# Patient Record
Sex: Male | Born: 1995 | Race: Black or African American | Hispanic: No | Marital: Single | Smoking: Current every day smoker
Health system: Southern US, Community
[De-identification: ages and names within clinical notes are randomized; demographics above are authoritative.]

## PROBLEM LIST (undated history)

## (undated) ENCOUNTER — Emergency Department (HOSPITAL_COMMUNITY): Payer: Self-pay

## (undated) ENCOUNTER — Emergency Department (HOSPITAL_COMMUNITY): Admission: EM | Payer: Self-pay | Source: Home / Self Care

---

## 2017-04-19 ENCOUNTER — Emergency Department (HOSPITAL_COMMUNITY)
Admission: EM | Admit: 2017-04-19 | Discharge: 2017-04-19 | Disposition: A | Discharge status: Home / Self Care | Payer: Self-pay | Attending: Emergency Medicine | Admitting: Emergency Medicine

## 2017-04-19 ENCOUNTER — Encounter (HOSPITAL_COMMUNITY): Payer: Self-pay | Admitting: Emergency Medicine

## 2017-04-19 ENCOUNTER — Emergency Department (HOSPITAL_COMMUNITY): Payer: Self-pay

## 2017-04-19 DIAGNOSIS — W01198A Fall on same level from slipping, tripping and stumbling with subsequent striking against other object, initial encounter: Secondary | ICD-10-CM | POA: Insufficient documentation

## 2017-04-19 DIAGNOSIS — Y929 Unspecified place or not applicable: Secondary | ICD-10-CM | POA: Insufficient documentation

## 2017-04-19 DIAGNOSIS — Y939 Activity, unspecified: Secondary | ICD-10-CM | POA: Insufficient documentation

## 2017-04-19 DIAGNOSIS — W19XXXA Unspecified fall, initial encounter: Secondary | ICD-10-CM

## 2017-04-19 DIAGNOSIS — Y998 Other external cause status: Secondary | ICD-10-CM | POA: Insufficient documentation

## 2017-04-19 DIAGNOSIS — S20211A Contusion of right front wall of thorax, initial encounter: Secondary | ICD-10-CM | POA: Insufficient documentation

## 2017-04-19 DIAGNOSIS — S40021A Contusion of right upper arm, initial encounter: Secondary | ICD-10-CM | POA: Insufficient documentation

## 2017-04-19 MED ORDER — HYDROCODONE-ACETAMINOPHEN 5-325 MG PO TABS
1.0000 | ORAL_TABLET | Freq: Once | ORAL | Status: AC
  Administered 2017-04-19: 1 via ORAL
  Filled 2017-04-19: qty 1

## 2017-04-19 MED ORDER — METHOCARBAMOL 500 MG PO TABS: 1000.0000 mg | ORAL_TABLET | Freq: Four times a day (QID) | ORAL | 0 refills | Status: DC | PRN

## 2017-04-19 NOTE — ED Notes (Signed)
Continues to wait for return to xray.

## 2017-04-19 NOTE — ED Notes (Signed)
Instructed on use of IS.
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
## 2017-04-19 NOTE — ED Notes (Signed)
Pt in xray

## 2017-04-19 NOTE — ED Triage Notes (Signed)
Pt sts right upper arm pain after trip and fall onto fire place yesterday; CMS intact

## 2017-04-19 NOTE — Discharge Instructions (Signed)
Rest, Ice intermittently (in the first 24-48 hours), Gentle compression with an Ace wrap, and elevate (Limb above the level of the heart)   Take up to 800mg  of ibuprofen (that is usually 4 over the counter pills)  3 times a day for 5 days. Take with food.  For breakthrough pain you may take Robaxin. Do not drink alcohol, drive or operate heavy machinery when taking Robaxin.  Only use the arm sling for up to 2 days. Take the arm out and rotate the shoulder every 4 hours.   Do not hesitate to return to the emergency room for any new, worsening or concerning symptoms.  Please obtain primary care using resource guide below. Let them know that you were seen in the emergency room and that they will need to obtain records for further outpatient management.

## 2017-04-19 NOTE — ED Notes (Signed)
Returned from xray

## 2017-04-19 NOTE — ED Notes (Signed)
Patient transported to X-ray 

## 2018-07-19 IMAGING — CR DG SHOULDER 2+V*R*
3 series · 3 of 3 positions shown · non-contrast
Comparison: 04/19/2017.

CLINICAL DATA: Fall.

EXAM:
RIGHT SHOULDER - 2+ VIEW

[shoulder grashey]
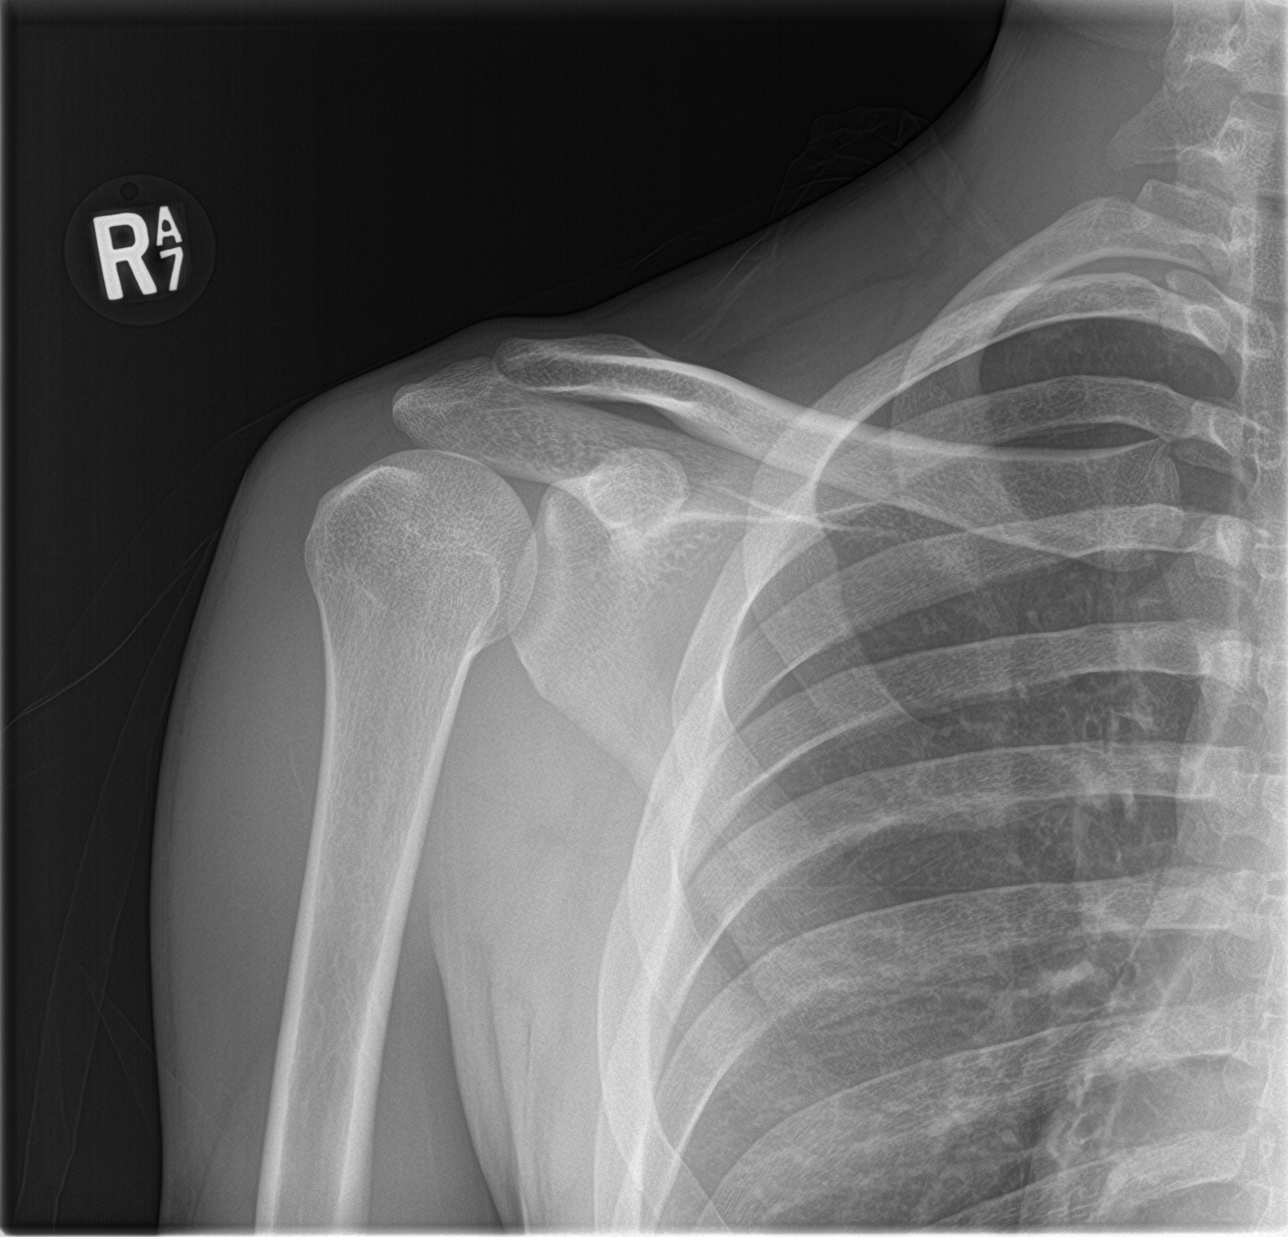

[shoulder y view]
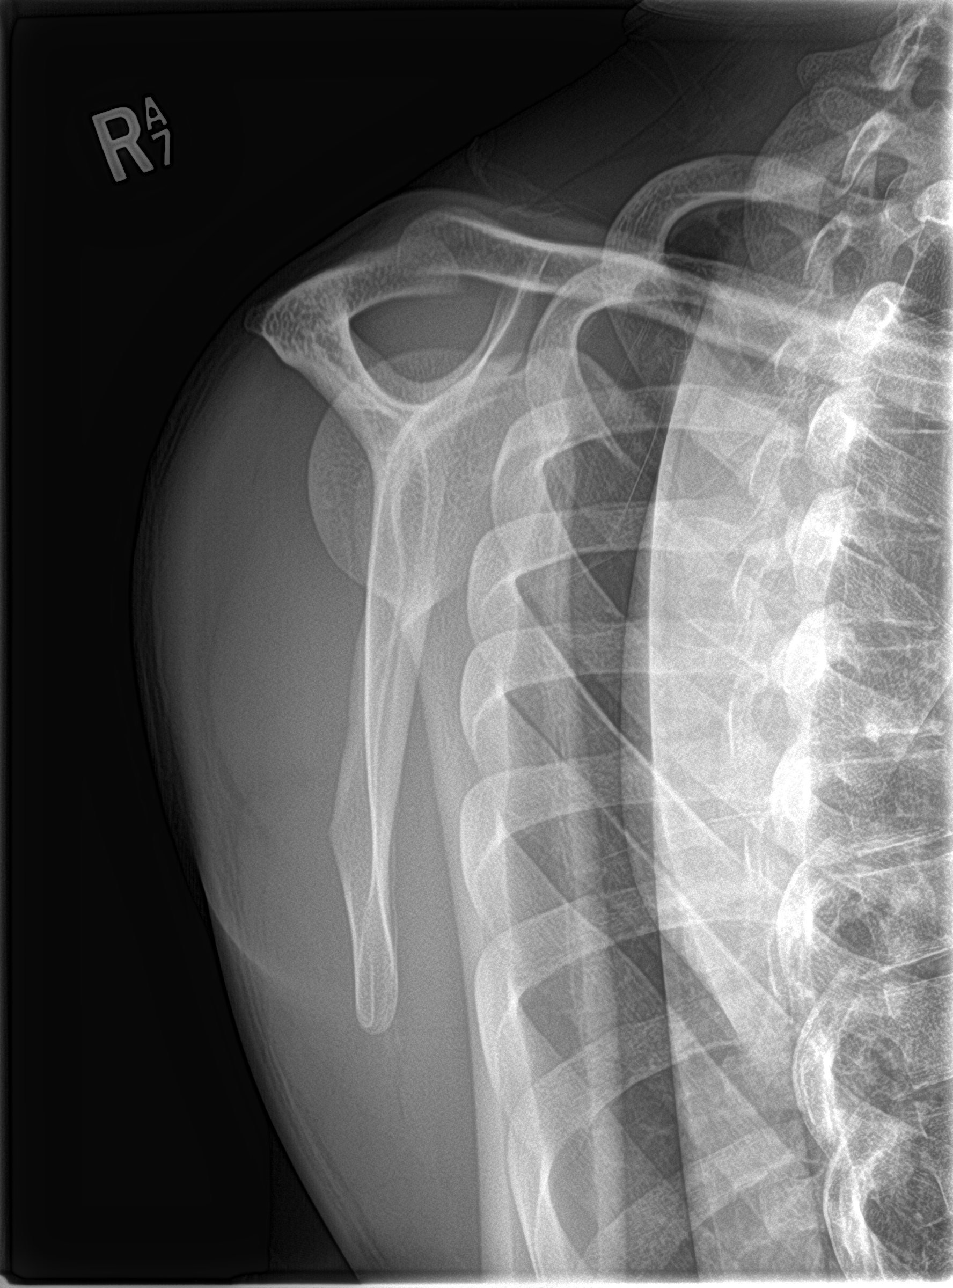

[shoulder axillary]
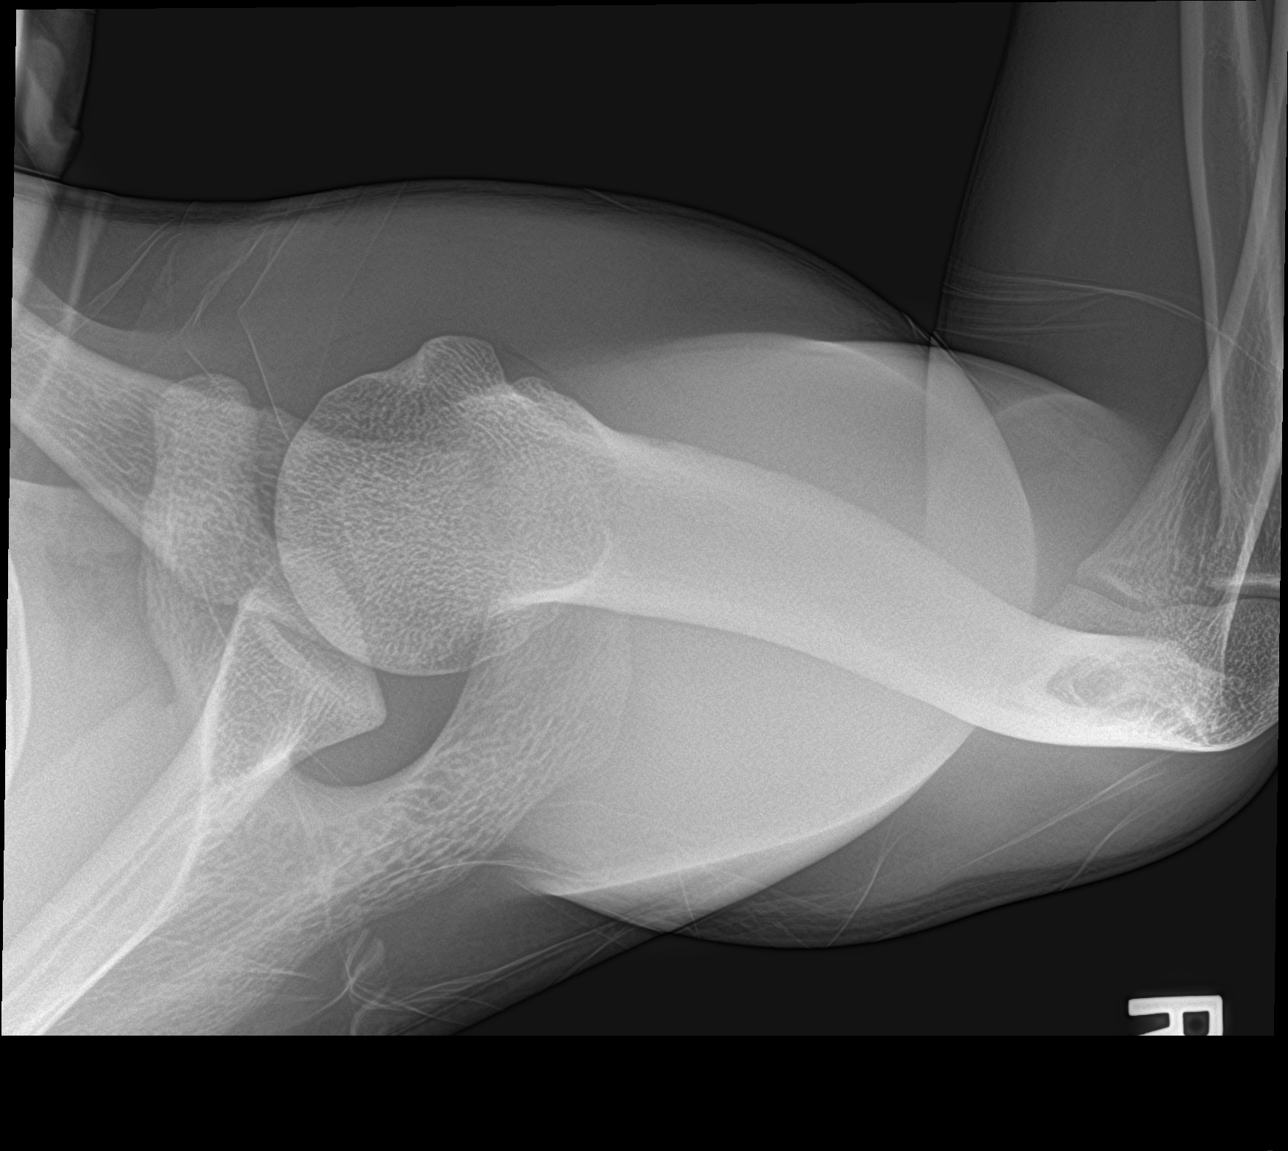

[3 of 3 positions shown; findings below may reference images not displayed]

FINDINGS: No acute bony or joint abnormality identified. No evidence of
fracture or dislocation.
IMPRESSION: No acute abnormality.

## 2021-09-13 ENCOUNTER — Emergency Department (HOSPITAL_COMMUNITY): Admission: EM | Admit: 2021-09-13 | Payer: Self-pay | Source: Home / Self Care

## 2021-09-13 ENCOUNTER — Encounter (HOSPITAL_COMMUNITY): Payer: Self-pay | Admitting: Emergency Medicine

## 2021-09-13 ENCOUNTER — Emergency Department (HOSPITAL_COMMUNITY): Payer: Self-pay

## 2021-09-13 ENCOUNTER — Emergency Department (HOSPITAL_COMMUNITY)
Admission: EM | Admit: 2021-09-13 | Discharge: 2021-09-13 | Disposition: A | Discharge status: Home / Self Care | Payer: Self-pay | Attending: Emergency Medicine | Admitting: Emergency Medicine

## 2021-09-13 DIAGNOSIS — S0990XA Unspecified injury of head, initial encounter: Secondary | ICD-10-CM

## 2021-09-13 DIAGNOSIS — S0083XA Contusion of other part of head, initial encounter: Secondary | ICD-10-CM | POA: Insufficient documentation

## 2021-09-13 DIAGNOSIS — M79671 Pain in right foot: Secondary | ICD-10-CM | POA: Insufficient documentation

## 2021-09-13 DIAGNOSIS — W108XXA Fall (on) (from) other stairs and steps, initial encounter: Secondary | ICD-10-CM | POA: Insufficient documentation

## 2021-09-13 MED ORDER — OXYCODONE-ACETAMINOPHEN 5-325 MG PO TABS
1.0000 | ORAL_TABLET | Freq: Once | ORAL | Status: AC
  Administered 2021-09-13: 1 via ORAL
  Filled 2021-09-13: qty 1

## 2021-09-13 NOTE — ED Provider Notes (Signed)
[facility]-EMERGENCY DEPT Provider Note   CSN: [id number] Arrival date & time: 09/13/21  1012     History  Chief Complaint  Patient presents with   Austin Cardenas    Austin Cardenas is a 26 y.o. male with no pertinent past medical history.  Presents to the emergency department with a chief complaint of pain to dorsum of right foot and contusion to right parietal scalp after suffering a fall.  Patient reports that fall occurred approximately 1:59 PM yesterday.  Patient states that while walking down steps he slipped on a toy, lost his balance and fell.  Patient reports falling down 4 steps.  Patient endorses hitting his head.  Patient denies any loss of consciousness, vomiting, or memory loss associated with his fall.  Patient states that he has had constant pain to dorsum of right foot since this injury.  Patient rates pain 9/10 on the pain scale.  Pain is worse with touch, movement, and weightbearing.  Patient reports that he took Aleve and ibuprofen yesterday with some improvement in his pain.  Patient denies taking any medications today.  Patient endorses swelling to dorsum of right foot and ankle after his injury which has since improved.  Patient complains of pain to contusion with touch.  Patient denies any neck pain, back pain, numbness, weakness, saddle anesthesia, bowel or bladder dysfunction, visual disturbance.  Patient denies any blood thinner use.   Fall Pertinent negatives include no chest pain, no abdominal pain and no headaches.      Home Medications Prior to Admission medications   Not on File      Allergies    Patient has no known allergies.    Review of Systems   Review of Systems  Constitutional:  Negative for chills and fever.  HENT:  Negative for facial swelling.   Eyes:  Negative for visual disturbance.  Cardiovascular:  Negative for chest pain.  Gastrointestinal:  Negative for abdominal pain, nausea and vomiting.  Genitourinary:  Negative  for difficulty urinating.  Musculoskeletal:  Positive for myalgias. Negative for back pain and neck pain.  Skin:  Negative for color change, pallor, rash and wound.  Neurological:  Negative for dizziness, tremors, seizures, syncope, facial asymmetry, speech difficulty, weakness, light-headedness, numbness and headaches.  Psychiatric/Behavioral:  Negative for confusion.    Physical Exam Updated Vital Signs BP 126/68 (BP Location: Right Arm)    Pulse 67    Temp 97.7 F (36.5 C) (Oral)    Resp 16    SpO2 100%  Physical Exam Vitals and nursing note reviewed.  Constitutional:      General: He is not in acute distress.    Appearance: He is not ill-appearing, toxic-appearing or diaphoretic.  HENT:     Head: Normocephalic. Contusion present. No raccoon eyes, Battle's sign, abrasion, right periorbital erythema, left periorbital erythema or laceration.     Jaw: No trismus or pain on movement.   Eyes:     General: No scleral icterus.       Right eye: No discharge.        Left eye: No discharge.     Extraocular Movements: Extraocular movements intact.     Conjunctiva/sclera: Conjunctivae normal.     Pupils: Pupils are equal, round, and reactive to light.  Cardiovascular:     Rate and Rhythm: Normal rate.     Pulses:          Dorsalis pedis pulses are 2+ on the right side and 2+ on the left  side.  Pulmonary:     Effort: Pulmonary effort is normal.  Musculoskeletal:     Cervical back: Normal range of motion and neck supple. No swelling, edema, deformity, erythema, signs of trauma, lacerations, rigidity, spasms, torticollis, tenderness, bony tenderness or crepitus. No pain with movement, spinous process tenderness or muscular tenderness. Normal range of motion.     Thoracic back: No swelling, edema, deformity, signs of trauma, lacerations, spasms, tenderness or bony tenderness.     Lumbar back: No swelling, edema, deformity, signs of trauma, lacerations, spasms, tenderness or bony tenderness.      Right lower leg: Normal.     Left lower leg: Normal.     Right ankle: No swelling, deformity, ecchymosis or lacerations. No tenderness. Decreased range of motion.     Left ankle: No swelling, deformity, ecchymosis or lacerations. No tenderness. Normal range of motion.     Right foot: Decreased range of motion. Normal capillary refill. Swelling, tenderness and bony tenderness present. No deformity, laceration or crepitus. Normal pulse.     Left foot: Normal range of motion and normal capillary refill. No swelling, deformity, laceration, tenderness, bony tenderness or crepitus. Normal pulse.     Comments: No midline tenderness or deformity to cervical, thoracic, or lumbar spine.  No tenderness, bony tenderness, or deformity to bilateral upper extremities and left lower extremity.  Patient has minimal swelling to dorsum of right foot.  Patient has tenderness throughout dorsum of right foot.  Decreased range of motion to all digits of right foot secondary to complaints of pain.  Decreased range of motion to right ankle secondary to complaints of pain in foot.  Feet:     Right foot:     Skin integrity: Dry skin present. No ulcer, blister, skin breakdown, erythema, warmth, callus or fissure.     Toenail Condition: Right toenails are normal.     Left foot:     Skin integrity: Dry skin present. No ulcer, blister, skin breakdown, erythema, warmth, callus or fissure.     Toenail Condition: Left toenails are normal.  Skin:    General: Skin is warm and dry.  Neurological:     General: No focal deficit present.     Mental Status: He is alert and oriented to person, place, and time.     GCS: GCS eye subscore is 4. GCS verbal subscore is 5. GCS motor subscore is 6.     Cranial Nerves: No cranial nerve deficit or facial asymmetry.     Sensory: Sensation is intact.     Motor: No weakness, tremor, seizure activity or pronator drift.     Coordination: Finger-Nose-Finger Test normal.     Comments: CN II-XII  intact, equal grip strength, +5 strength to bilateral upper and lower extremities.  Sensation to light touch grossly intact to bilateral upper and lower extremities.  Unable to assess patient's gait due to reports of increased pain with weightbearing.  Psychiatric:        Behavior: Behavior is cooperative.    ED Results / Procedures / Treatments   Labs (all labs ordered are listed, but only abnormal results are displayed) Labs Reviewed - No data to display  EKG None  Radiology DG Foot Complete Right  Result Date: 09/13/2021 CLINICAL DATA:  Trauma, fall EXAM: RIGHT FOOT COMPLETE - 3+ VIEW COMPARISON:  None. FINDINGS: There is no evidence of fracture or dislocation. There is no evidence of arthropathy or other focal bone abnormality. There is mild hallux valgus deformity. Soft tissues are  unremarkable. IMPRESSION: No fracture or dislocation is seen in the right foot. Electronically Signed   By: Ernie Avena M.D.   On: 09/13/2021 11:00    Procedures Procedures    Medications Ordered in ED Medications  oxyCODONE-acetaminophen (PERCOCET/ROXICET) 5-325 MG per tablet 1 tablet (1 tablet Oral Given 09/13/21 1159)    ED Course/ Medical Decision Making/ A&P                           Medical Decision Making  Alert 26 year old male in no acute distress, nontoxic-appearing.  Presents to the emergency department with a chief complaint of contusion to right parietal aspect of scalp and pain to dorsum of right foot after suffering a mechanical fall down 4 steps.  Patient does endorse hitting his head but denies any loss of consciousness.  Patient is not on any blood thinners.  Additional information was obtained from patient's fianc at bedside.  [place] CT head injury/trauma rule was used to rule out the need for CT head imaging.  X-ray imaging/noncontrast CT imaging of cervical, thoracic, and lumbar spine were considered however not ordered as patient has no midline tenderness or  deformity on exam.  Patient's neuro exam is reassuring.  X-ray imaging of right foot was ordered and independently reviewed by myself.  I agree with the radiologist interpretation of no acute osseous abnormality.  Patient presents to the emergency department with his own Cam walking boot and crutches.  Due to patient's report of increased pain with movement and weightbearing patient advised to use cam walking boot and ambulate as tolerated.  Patient given information to follow-up with orthopedic provider in outpatient setting.  Patient was given Percocet for pain management.  Discussed symptomatic treatment of pain with over-the-counter pain medication, elevation, ice and compression with patient.  Discussed results, findings, treatment and follow up. Patient advised of return precautions. Patient verbalized understanding and agreed with plan.         Final Clinical Impression(s) / ED Diagnoses Final diagnoses:  Fall (on) (from) other stairs and steps, initial encounter    Rx / DC Orders ED Discharge Orders     None         Berneice Heinrich 09/13/21 1634    Jacalyn Lefevre, MD 09/14/21 [phone]

## 2021-09-13 NOTE — ED Triage Notes (Addendum)
Patient reports fall down four concrete steps yesterday. C/o pain to R foot. Hit head with hematoma to posterior head. Denies LOC. Denies taking blood thinner. Patient arrives with cam boot on foot from from home.

## 2021-09-13 NOTE — Discharge Instructions (Addendum)
You came to the emergency department today to be evaluated for your injuries after suffering a fall.  The x-ray of your right foot showed no broken bones or dislocations.  You may bear weight and walk as tolerated.  Please follow-up with the orthopedic provider listed on this paperwork.  Today you received medications that may make you sleepy or impair your ability to make decisions.  For the next 24 hours please do not drive, operate heavy machinery, care for a small child with out another adult present, or perform any activities that may cause harm to you or someone else if you were to fall asleep or be impaired.   Please take Ibuprofen (Advil, motrin) and Tylenol (acetaminophen) to relieve your pain.    You may take up to 600 MG (3 pills) of normal strength ibuprofen every 8 hours as needed.   You make take tylenol, up to 1,000 mg (two extra strength pills) every 8 hours as needed.   It is safe to take ibuprofen and tylenol at the same time as they work differently.   Do not take more than 3,000 mg tylenol in a 24 hour period (not more than one dose every 8 hours.  Please check all medication labels as many medications such as pain and cold medications may contain tylenol.  Do not drink alcohol while taking these medications.  Do not take other NSAID'S while taking ibuprofen (such as aleve or naproxen).  Please take ibuprofen with food to decrease stomach upset.   Get help right away if: You have: A severe headache that is not helped by medicine. Trouble walking or weakness in your arms and legs. Clear or bloody fluid coming from your nose or ears. Changes in your vision. A seizure. Increased confusion or irritability. Your symptoms get worse. You are sleepier than normal and have trouble staying awake. You lose your balance. Your pupils change size. Your speech is slurred. Your dizziness gets worse. You vomit.

## 2022-05-13 ENCOUNTER — Ambulatory Visit (HOSPITAL_COMMUNITY)
Admission: EM | Admit: 2022-05-13 | Discharge: 2022-05-13 | Disposition: A | Discharge status: Home / Self Care | Payer: Self-pay | Attending: Family Medicine | Admitting: Family Medicine

## 2022-05-13 ENCOUNTER — Other Ambulatory Visit: Payer: Self-pay

## 2022-05-13 ENCOUNTER — Encounter (HOSPITAL_COMMUNITY): Payer: Self-pay | Admitting: Emergency Medicine

## 2022-05-13 ENCOUNTER — Ambulatory Visit (HOSPITAL_COMMUNITY)
Admission: EM | Admit: 2022-05-13 | Discharge: 2022-05-13 | Discharge status: Absent without leave | Payer: Self-pay | Attending: Family Medicine | Admitting: Family Medicine

## 2022-05-13 DIAGNOSIS — K0889 Other specified disorders of teeth and supporting structures: Secondary | ICD-10-CM

## 2022-05-13 MED ORDER — HYDROCODONE-ACETAMINOPHEN 5-325 MG PO TABS: 1.0000 | ORAL_TABLET | Freq: Four times a day (QID) | ORAL | 0 refills | Status: DC | PRN

## 2022-05-13 MED ORDER — IBUPROFEN 800 MG PO TABS: 800.0000 mg | ORAL_TABLET | Freq: Three times a day (TID) | ORAL | 0 refills | Status: DC

## 2022-05-13 MED ORDER — AMOXICILLIN 875 MG PO TABS: 875.0000 mg | ORAL_TABLET | Freq: Two times a day (BID) | ORAL | 0 refills | Status: AC

## 2022-05-13 NOTE — ED Triage Notes (Signed)
Pain is on bottom, right of mouth.  Pain started yesterday

## 2022-05-13 NOTE — Discharge Instructions (Addendum)

## 2022-05-13 NOTE — ED Provider Notes (Signed)
  [facility]   [id number] 05/13/22 Arrival Time: 1717  ASSESSMENT & PLAN:  1. Pain, dental    No sign of abscess requiring I&D at this time. Discussed.  Meds ordered this encounter  Medications   amoxicillin (AMOXIL) 875 MG tablet    Sig: Take 1 tablet (875 mg total) by mouth 2 (two) times daily for 10 days.    Dispense:  20 tablet    Refill:  0   HYDROcodone-acetaminophen (NORCO/VICODIN) 5-325 MG tablet    Sig: Take 1 tablet by mouth every 6 (six) hours as needed for moderate pain or severe pain.    Dispense:  4 tablet    Refill:  0   ibuprofen (ADVIL) 800 MG tablet    Sig: Take 1 tablet (800 mg total) by mouth 3 (three) times daily with meals.    Dispense:  14 tablet    Refill:  0    [place] Controlled Substances Registry consulted for this patient. I feel the risk/benefit ratio today is favorable for proceeding with this prescription for a controlled substance. Medication sedation precautions given.  Dental resource written instructions given. He will schedule dental evaluation as soon as possible if not improving over the next 24-48 hours.  Reviewed expectations re: course of current medical issues. Questions answered. Outlined signs and symptoms indicating need for more acute intervention. Patient verbalized understanding. After Visit Summary given.   SUBJECTIVE:  Austin Cardenas is a 26 y.o. male who reports abrupt onset of right lower molar dental pain described as aching/throbbing. Noted yesterday; worse today. Fever: absent. Tolerating PO intake but reports pain with chewing. Normal swallowing. He does not see a dentist regularly. No neck swelling or pain. OTC analgesics without relief.  OBJECTIVE: Vitals:   05/13/22 1725  BP: 108/66  Pulse: 79  Resp: 18  Temp: 98.6 F (37 C)  TempSrc: Oral  SpO2: 96%    General appearance: alert; no distress HENT: normocephalic; atraumatic; dentition: fair; right lower gum without areas of fluctuance, drainage, or  bleeding and with tenderness to palpation; broken #31 molar; normal jaw movement without difficulty Neck: supple without LAD; FROM; trachea midline Lungs: normal respirations; unlabored; speaks full sentences without difficulty Skin: warm and dry Psychological: alert and cooperative; normal mood and affect  No Known Allergies  History reviewed. No pertinent past medical history. Social History   Socioeconomic History   Marital status: Single    Spouse name: Not on file   Number of children: Not on file   Years of education: Not on file   Highest education level: Not on file  Occupational History   Not on file  Tobacco Use   Smoking status: Some Days    Types: Cigarettes   Smokeless tobacco: Never  Vaping Use   Vaping Use: Never used  Substance and Sexual Activity   Alcohol use: Yes   Drug use: No   Sexual activity: Not on file  Other Topics Concern   Not on file  Social History Narrative   Not on file   Social Determinants of Health   Financial Resource Strain: Not on file  Food Insecurity: Not on file  Transportation Needs: Not on file  Physical Activity: Not on file  Stress: Not on file  Social Connections: Not on file  Intimate Partner Violence: Not on file   History reviewed. No pertinent family history. History reviewed. No pertinent surgical history.    Mardella Layman, MD 05/13/22 1755

## 2022-08-22 ENCOUNTER — Emergency Department (HOSPITAL_COMMUNITY)
Admission: EM | Admit: 2022-08-22 | Discharge: 2022-08-22 | Disposition: A | Discharge status: Home / Self Care | Payer: Self-pay | Attending: Emergency Medicine | Admitting: Emergency Medicine

## 2022-08-22 ENCOUNTER — Encounter (HOSPITAL_COMMUNITY): Payer: Self-pay

## 2022-08-22 ENCOUNTER — Emergency Department (HOSPITAL_COMMUNITY): Payer: Self-pay

## 2022-08-22 ENCOUNTER — Other Ambulatory Visit: Payer: Self-pay

## 2022-08-22 DIAGNOSIS — Y92009 Unspecified place in unspecified non-institutional (private) residence as the place of occurrence of the external cause: Secondary | ICD-10-CM | POA: Insufficient documentation

## 2022-08-22 DIAGNOSIS — S8011XA Contusion of right lower leg, initial encounter: Secondary | ICD-10-CM | POA: Insufficient documentation

## 2022-08-22 DIAGNOSIS — W228XXA Striking against or struck by other objects, initial encounter: Secondary | ICD-10-CM | POA: Insufficient documentation

## 2022-08-22 NOTE — Care Management (Signed)
PCP information placed on AVS
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
## 2022-08-22 NOTE — Discharge Instructions (Signed)
Use the crutches as needed.  Tylenol or ibuprofen as needed for pain.  Ice and elevate.  Thankfully nothing is broken today.

## 2022-09-09 ENCOUNTER — Ambulatory Visit (HOSPITAL_COMMUNITY)
Admission: EM | Admit: 2022-09-09 | Discharge: 2022-09-09 | Disposition: A | Discharge status: Home / Self Care | Payer: Self-pay | Attending: Sports Medicine | Admitting: Sports Medicine

## 2022-09-09 ENCOUNTER — Encounter (HOSPITAL_COMMUNITY): Payer: Self-pay | Admitting: Emergency Medicine

## 2022-09-09 DIAGNOSIS — M79604 Pain in right leg: Secondary | ICD-10-CM
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
## 2022-09-09 NOTE — Discharge Instructions (Signed)
For your right leg pain you likely have sustained a bone bruise.  Use his cam walker boot for a couple of weeks to let your pain subside.  I have also provided information for an orthopedic doctor to follow-up with if your symptoms do not resolve.  Continue to ice and work on gentle range of motion exercises.
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
## 2022-12-24 ENCOUNTER — Encounter (HOSPITAL_COMMUNITY): Payer: Self-pay | Admitting: *Deleted

## 2022-12-24 ENCOUNTER — Emergency Department (HOSPITAL_COMMUNITY)
Admission: EM | Admit: 2022-12-24 | Discharge: 2022-12-25 | Disposition: A | Discharge status: Home / Self Care | Payer: 59 | Attending: Emergency Medicine | Admitting: Emergency Medicine

## 2022-12-24 ENCOUNTER — Other Ambulatory Visit: Payer: Self-pay

## 2022-12-24 DIAGNOSIS — K0889 Other specified disorders of teeth and supporting structures: Secondary | ICD-10-CM | POA: Insufficient documentation

## 2022-12-24 DIAGNOSIS — R6884 Jaw pain: Secondary | ICD-10-CM | POA: Diagnosis present

## 2022-12-24 DIAGNOSIS — K029 Dental caries, unspecified: Secondary | ICD-10-CM | POA: Diagnosis not present

## 2022-12-24 NOTE — ED Triage Notes (Signed)
The pts rt lower back tooth was knocked loose  that is were his pain is
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
## 2022-12-25 MED ORDER — IBUPROFEN 800 MG PO TABS: 800.0000 mg | ORAL_TABLET | Freq: Four times a day (QID) | ORAL | 0 refills | Status: AC | PRN

## 2022-12-25 MED ORDER — IBUPROFEN 800 MG PO TABS
800.0000 mg | ORAL_TABLET | Freq: Once | ORAL | Status: AC
  Administered 2022-12-25: 800 mg via ORAL
  Filled 2022-12-25: qty 1

## 2022-12-25 MED ORDER — AMOXICILLIN 500 MG PO CAPS: 500.0000 mg | ORAL_CAPSULE | Freq: Three times a day (TID) | ORAL | 0 refills | Status: DC

## 2022-12-25 MED ORDER — AMOXICILLIN 500 MG PO CAPS
500.0000 mg | ORAL_CAPSULE | Freq: Once | ORAL | Status: AC
  Administered 2022-12-25: 500 mg via ORAL
  Filled 2022-12-25: qty 1

## 2022-12-25 MED ORDER — ACETAMINOPHEN 500 MG PO TABS
1000.0000 mg | ORAL_TABLET | Freq: Once | ORAL | Status: AC
  Administered 2022-12-25: 1000 mg via ORAL
  Filled 2022-12-25: qty 2
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
## 2023-03-01 ENCOUNTER — Emergency Department (HOSPITAL_COMMUNITY): Payer: 59

## 2023-03-01 ENCOUNTER — Encounter (HOSPITAL_COMMUNITY): Payer: Self-pay

## 2023-03-01 ENCOUNTER — Other Ambulatory Visit: Payer: Self-pay

## 2023-03-01 ENCOUNTER — Emergency Department (HOSPITAL_COMMUNITY)
Admission: EM | Admit: 2023-03-01 | Discharge: 2023-03-01 | Disposition: A | Discharge status: Home / Self Care | Payer: 59 | Attending: Emergency Medicine | Admitting: Emergency Medicine

## 2023-03-01 DIAGNOSIS — M272 Inflammatory conditions of jaws: Secondary | ICD-10-CM | POA: Diagnosis not present

## 2023-03-01 DIAGNOSIS — K029 Dental caries, unspecified: Secondary | ICD-10-CM | POA: Diagnosis not present

## 2023-03-01 DIAGNOSIS — K0889 Other specified disorders of teeth and supporting structures: Secondary | ICD-10-CM | POA: Diagnosis present

## 2023-03-01 LAB — CBC WITH DIFFERENTIAL/PLATELET
Abs Immature Granulocytes: 0.03 10*3/uL (ref 0.00–0.07)
Basophils Absolute: 0 10*3/uL (ref 0.0–0.1)
Basophils Relative: 0 %
Eosinophils Absolute: 0.1 10*3/uL (ref 0.0–0.5)
Eosinophils Relative: 1 %
HCT: 44.5 % (ref 39.0–52.0)
Hemoglobin: 15 g/dL (ref 13.0–17.0)
Immature Granulocytes: 0 %
Lymphocytes Relative: 10 %
Lymphs Abs: 1 10*3/uL (ref 0.7–4.0)
MCH: 32.9 pg (ref 26.0–34.0)
MCHC: 33.7 g/dL (ref 30.0–36.0)
MCV: 97.6 fL (ref 80.0–100.0)
Monocytes Absolute: 0.8 10*3/uL (ref 0.1–1.0)
Monocytes Relative: 8 %
Neutro Abs: 8 10*3/uL — ABNORMAL HIGH (ref 1.7–7.7)
Neutrophils Relative %: 81 %
Platelets: 227 10*3/uL (ref 150–400)
RBC: 4.56 MIL/uL (ref 4.22–5.81)
RDW: 12.9 % (ref 11.5–15.5)
WBC: 10 10*3/uL (ref 4.0–10.5)
nRBC: 0 % (ref 0.0–0.2)

## 2023-03-01 LAB — I-STAT CHEM 8, ED
BUN: 5 mg/dL — ABNORMAL LOW (ref 6–20)
Calcium, Ion: 1.21 mmol/L (ref 1.15–1.40)
Chloride: 104 mmol/L (ref 98–111)
Creatinine, Ser: 0.8 mg/dL (ref 0.61–1.24)
Glucose, Bld: 114 mg/dL — ABNORMAL HIGH (ref 70–99)
HCT: 50 % (ref 39.0–52.0)
Hemoglobin: 17 g/dL (ref 13.0–17.0)
Potassium: 3.9 mmol/L (ref 3.5–5.1)
Sodium: 140 mmol/L (ref 135–145)
TCO2: 23 mmol/L (ref 22–32)

## 2023-03-01 LAB — BASIC METABOLIC PANEL
Anion gap: 10 (ref 5–15)
BUN: 5 mg/dL — ABNORMAL LOW (ref 6–20)
CO2: 23 mmol/L (ref 22–32)
Calcium: 9.6 mg/dL (ref 8.9–10.3)
Chloride: 104 mmol/L (ref 98–111)
Creatinine, Ser: 0.9 mg/dL (ref 0.61–1.24)
GFR, Estimated: >60 mL/min (ref >60)
Glucose, Bld: 116 mg/dL — ABNORMAL HIGH (ref 70–99)
Potassium: 3.8 mmol/L (ref 3.5–5.1)
Sodium: 137 mmol/L (ref 135–145)

## 2023-03-01 MED ORDER — FENTANYL CITRATE PF 50 MCG/ML IJ SOSY
50.0000 ug | PREFILLED_SYRINGE | Freq: Once | INTRAMUSCULAR | Status: AC
  Administered 2023-03-01: 50 ug via INTRAVENOUS
  Filled 2023-03-01: qty 1

## 2023-03-01 MED ORDER — CLINDAMYCIN HCL 150 MG PO CAPS: 300.0000 mg | ORAL_CAPSULE | Freq: Three times a day (TID) | ORAL | 0 refills | Status: DC

## 2023-03-01 MED ORDER — OXYCODONE-ACETAMINOPHEN 5-325 MG PO TABS: 2.5000 | ORAL_TABLET | Freq: Four times a day (QID) | ORAL | 0 refills | Status: AC | PRN

## 2023-03-01 MED ORDER — DEXAMETHASONE SODIUM PHOSPHATE 10 MG/ML IJ SOLN
10.0000 mg | Freq: Once | INTRAMUSCULAR | Status: AC
  Administered 2023-03-01: 10 mg via INTRAMUSCULAR
  Filled 2023-03-01: qty 1

## 2023-03-01 MED ORDER — IOHEXOL 350 MG/ML SOLN
75.0000 mL | Freq: Once | INTRAVENOUS | Status: AC | PRN
  Administered 2023-03-01: 75 mL via INTRAVENOUS

## 2023-03-01 MED ORDER — ONDANSETRON HCL 4 MG/2ML IJ SOLN
4.0000 mg | Freq: Once | INTRAMUSCULAR | Status: AC
  Administered 2023-03-01: 4 mg via INTRAVENOUS
  Filled 2023-03-01: qty 2

## 2023-03-01 MED ORDER — CLINDAMYCIN HCL 150 MG PO CAPS: 300.0000 mg | ORAL_CAPSULE | Freq: Three times a day (TID) | ORAL | 0 refills | Status: AC

## 2023-03-01 MED ORDER — SODIUM CHLORIDE 0.9 % IV BOLUS
500.0000 mL | Freq: Once | INTRAVENOUS | Status: AC
  Administered 2023-03-01: 500 mL via INTRAVENOUS

## 2023-03-01 MED ORDER — CLINDAMYCIN PHOSPHATE 600 MG/50ML IV SOLN
600.0000 mg | Freq: Once | INTRAVENOUS | Status: AC
  Administered 2023-03-01: 600 mg via INTRAVENOUS
  Filled 2023-03-01: qty 50

## 2023-03-01 NOTE — ED Triage Notes (Signed)
Pt states he has a tooth abscess right lower bottomx2d. Pt has 2+ swelling of right lower mouth area. Pt states he has pain in right side of neck and right side of head.

## 2023-03-01 NOTE — Discharge Instructions (Signed)
Contact a dental care provider if:  You have any unexplained dental pain.  Your pain is not controlled with medicines.  Your symptoms get worse.  You have new symptoms.  Get help right away if:  You are unable to open your mouth.  You are having trouble breathing or swallowing.  You have a fever.  You notice that your face, neck, or jaw is swollen.  These symptoms may represent a serious problem that is an emergency. Do not wait to see if the symptoms will go away. Get medical help right away. Call your local emergency services (911 in the [place] Do not drive yourself to the hospital.

## 2023-03-01 NOTE — ED Provider Notes (Signed)
[place] EMERGENCY DEPARTMENT AT [facility] Provider Note   CSN: [id number] Arrival date & time: 03/01/23  0844     History  Chief Complaint  Patient presents with   Dental Pain    Austin Cardenas is a 27 y.o. male who presents emergency department with chief complaint of dental infection.  Patient states that he started having pain and pain in his right tooth yesterday.  Overnight he had swelling over the right mandible now extending into the anterior right side of his neck.  He has associated pain with swallowing and trismus.  He reports a previous history of a dental abscess involving the same tooth several years ago.  He states that that occurred slowly over time but this was extremely rapid.  He denies change in voice, difficulty breathing or swelling under his tongue.   Dental Pain      Home Medications Prior to Admission medications   Medication Sig Start Date End Date Taking? Authorizing Provider  oxyCODONE-acetaminophen (PERCOCET/ROXICET) 5-325 MG tablet Take 2.5-5 tablets by mouth every 6 (six) hours as needed for severe pain. 03/01/23  Yes Cutberto Winfree, PA-C  amoxicillin (AMOXIL) 500 MG capsule Take 1 capsule (500 mg total) by mouth 3 (three) times daily. Patient not taking: Reported on 03/01/2023 12/25/22   Gilda Crease, MD  clindamycin (CLEOCIN) 150 MG capsule Take 2 capsules (300 mg total) by mouth 3 (three) times daily for 7 days. 03/01/23 03/08/23  Arthor Captain, PA-C  ibuprofen (ADVIL) 800 MG tablet Take 1 tablet (800 mg total) by mouth every 6 (six) hours as needed for moderate pain. Patient not taking: Reported on 03/01/2023 12/25/22   Gilda Crease, MD      Allergies    Patient has no known allergies.    Review of Systems   Review of Systems  Physical Exam Updated Vital Signs BP 120/75   Pulse 81   Temp 99 F (37.2 C) (Oral)   Resp 16   SpO2 99%  Physical Exam Vitals and nursing note reviewed.  Constitutional:      General:  He is not in acute distress.    Appearance: He is well-developed. He is not diaphoretic.  HENT:     Head: Normocephalic and atraumatic.     Mouth/Throat:      Comments: Right lower premolar with extensive decay, exposed pulp and dentin, swelling noted in the proximal gingiva and extending over the mid mandible on the right side.  Swelling erythema and tenderness extends beyond the angle of the mandible into the right anterior neck.  He is tender to palpation.  Some associated trismus noted, no swelling in the sublingual region. Eyes:     General: No scleral icterus.    Conjunctiva/sclera: Conjunctivae normal.  Cardiovascular:     Rate and Rhythm: Normal rate and regular rhythm.     Heart sounds: Normal heart sounds.  Pulmonary:     Effort: Pulmonary effort is normal. No respiratory distress.     Breath sounds: Normal breath sounds.  Abdominal:     Palpations: Abdomen is soft.     Tenderness: There is no abdominal tenderness.  Musculoskeletal:     Cervical back: Normal range of motion and neck supple.  Skin:    General: Skin is warm and dry.  Neurological:     Mental Status: He is alert.  Psychiatric:        Behavior: Behavior normal.     ED Results / Procedures / Treatments  Labs (all labs ordered are listed, but only abnormal results are displayed) Labs Reviewed  CBC WITH DIFFERENTIAL/PLATELET - Abnormal; Notable for the following components:      Result Value   Neutro Abs 8.0 (*)    All other components within normal limits  BASIC METABOLIC PANEL - Abnormal; Notable for the following components:   Glucose, Bld 116 (*)    BUN 5 (*)    All other components within normal limits  I-STAT CHEM 8, ED - Abnormal; Notable for the following components:   BUN 5 (*)    Glucose, Bld 114 (*)    All other components within normal limits    EKG None  Radiology CT Soft Tissue Neck W Contrast  Result Date: 03/01/2023 CLINICAL DATA:  Pain in a right lower tooth for 2 days with  swelling in the right lower mouth. Pain along the right side of neck and right head. EXAM: CT NECK WITH CONTRAST TECHNIQUE: Multidetector CT imaging of the neck was performed using the standard protocol following the bolus administration of intravenous contrast. RADIATION DOSE REDUCTION: This exam was performed according to the departmental dose-optimization program which includes automated exposure control, adjustment of the mA and/or kV according to patient size and/or use of iterative reconstruction technique. CONTRAST:  75mL OMNIPAQUE IOHEXOL 350 MG/ML SOLN COMPARISON:  None Available. FINDINGS: Pharynx and larynx: The nasal cavity and nasopharynx are unremarkable. There is periapical lucency around the root of the left maxillary lateral incisor (7-38). There is prominent carious lesion of the right mandibular first molar with a small periapical lucency around the root of this tooth. There is a 1.3 cc cm x 0.6 cm TV by 1.6 cm AP peripherally enhancing hypodense focus along the buccal cortex of the adjacent mandible suspicious for phlegmon and developing abscess (5-36, 3-57). There is overlying fat stranding and fascial thickening. There are additional carious lesions of the left maxillary first and third molars and right maxillary third molar with prominent periapical lucency around the root of left third molar. No other soft tissue abscess is identified. The oral cavity and oropharynx are otherwise unremarkable. The parapharyngeal spaces are clear. The hypopharynx and larynx are unremarkable. The vocal folds are normal. The epiglottis is unremarkable. There is no retropharyngeal fluid collection. The airway is patent. Salivary glands: The parotid and submandibular glands are unremarkable. Thyroid: Unremarkable. Lymph nodes: A mildly prominent right level IIA lymph node is noted, likely reactive. Vascular: The major vasculature of the neck is unremarkable. Limited intracranial: Unremarkable. Visualized orbits:  Unremarkable. Mastoids and visualized paranasal sinuses: Clear. Skeleton: There is no acute osseous abnormality or suspicious osseous lesion. Upper chest: The imaged lung apices are clear. Other: None. IMPRESSION: 1. 1.3 x 0.6 x 1.6 cm peripherally enhancing hypodense focus along the buccal cortex of the adjacent mandible suspicious for phlegmon and developing abscess with surrounding inflammatory change, likely odontogenic in nature related to the adjacent first molar. 2. Additional dental disease as above. Electronically Signed   By: Lesia Hausen M.D.   On: 03/01/2023 12:06    Procedures Procedures    Medications Ordered in ED Medications  fentaNYL (SUBLIMAZE) injection 50 mcg (50 mcg Intravenous Given 03/01/23 0944)  sodium chloride 0.9 % bolus 500 mL (0 mLs Intravenous Stopped 03/01/23 1120)  clindamycin (CLEOCIN) IVPB 600 mg (0 mg Intravenous Stopped 03/01/23 1042)  ondansetron (ZOFRAN) injection 4 mg (4 mg Intravenous Given 03/01/23 1156)  iohexol (OMNIPAQUE) 350 MG/ML injection 75 mL (75 mLs Intravenous Contrast Given 03/01/23 1120)  fentaNYL (SUBLIMAZE) injection 50 mcg (50 mcg Intravenous Given 03/01/23 1156)  dexamethasone (DECADRON) injection 10 mg (10 mg Intramuscular Given 03/01/23 1237)    ED Course/ Medical Decision Making/ A&P Clinical Course as of 03/01/23 1655  Tue Mar 01, 2023  1017 WBC: 10.0 [AH]  1017 BUN(!): 5 [AH]  1017 Creatinine: 0.80 [AH]    Clinical Course User Index [AH] Arthor Captain, PA-C                             Medical Decision Making Amount and/or Complexity of Data Reviewed Labs: ordered. Decision-making details documented in ED Course. Radiology: ordered.  Risk Prescription drug management.   This patient presents to the ED for concern of facial swelling and tooth pain, this involves an extensive number of treatment options, and is a complaint that carries with it a high risk of complications and morbidity.  Periapical abscess, Ludwig angina.  Co  morbidities:  poor dentition  Social Determinants of Health:   SDOH Screenings   Tobacco Use: High Risk (03/01/2023)     Additional history:  {Additional history obtained from patient's significant other   Lab Tests:  I Ordered, and personally interpreted labs.  The pertinent results include:   Labs reviewed, no significant findings, mildly elevated glucose likely acute phase reaction. Imaging Studies:  I ordered imaging studies including CT soft tissue of the neck I independently visualized and interpreted imaging which showed odontogenic infection with phlegmon, no localized abscess I agree with the radiologist interpretation  Cardiac Monitoring/ECG:  Medicines ordered and prescription drug management:  I ordered medication including  Medications  fentaNYL (SUBLIMAZE) injection 50 mcg (50 mcg Intravenous Given 03/01/23 0944)  sodium chloride 0.9 % bolus 500 mL (0 mLs Intravenous Stopped 03/01/23 1120)  clindamycin (CLEOCIN) IVPB 600 mg (0 mg Intravenous Stopped 03/01/23 1042)  ondansetron (ZOFRAN) injection 4 mg (4 mg Intravenous Given 03/01/23 1156)  iohexol (OMNIPAQUE) 350 MG/ML injection 75 mL (75 mLs Intravenous Contrast Given 03/01/23 1120)  fentaNYL (SUBLIMAZE) injection 50 mcg (50 mcg Intravenous Given 03/01/23 1156)  dexamethasone (DECADRON) injection 10 mg (10 mg Intramuscular Given 03/01/23 1237)   for odontogenic infection, swelling Reevaluation of the patient after these medicines showed that the patient improved I have reviewed the patients home medicines and have made adjustments as needed  Test Considered:    Critical Interventions:   Joseph Art, antibiotics, pain meds, Decadron for swelling  Consultations Obtained:   Problem List / ED Course:     ICD-10-CM   1. Odontogenic infection of jaw  M27.2       MDM: 27 year old male here with swelling of the right mandible extending below the angle of the mandible and into the neck.  CT findings consistent with  odontogenic infection without obvious airway compromise, Ludwig angina or localized abscess.  I discussed findings with the patient was comfortable with discharge.  Will give pain medications, antibiotics, strict return precautions including worsening swelling, fevers, lethargy, inability to swallow, change in voice or any new concerns.  Patient given referral to dentist for tooth removal.  Appears otherwise appropriate for discharge at this time         Final Clinical Impression(s) / ED Diagnoses Final diagnoses:  Odontogenic infection of jaw    Rx / DC Orders ED Discharge Orders          Ordered    clindamycin (CLEOCIN) 150 MG capsule  3 times daily,   Status:  Discontinued  03/01/23 1254    oxyCODONE-acetaminophen (PERCOCET/ROXICET) 5-325 MG tablet  Every 6 hours PRN        03/01/23 1254    clindamycin (CLEOCIN) 150 MG capsule  3 times daily        03/01/23 1307              Arthor Captain, PA-C 03/01/23 1655    Gerhard Munch, MD 03/08/23 [phone]

## 2023-08-07 ENCOUNTER — Emergency Department (HOSPITAL_COMMUNITY): Payer: 59

## 2023-08-07 ENCOUNTER — Other Ambulatory Visit: Payer: Self-pay

## 2023-08-07 ENCOUNTER — Emergency Department (HOSPITAL_COMMUNITY)
Admission: EM | Admit: 2023-08-07 | Discharge: 2023-08-07 | Disposition: A | Discharge status: Home / Self Care | Payer: 59 | Attending: Emergency Medicine | Admitting: Emergency Medicine

## 2023-08-07 ENCOUNTER — Encounter (HOSPITAL_COMMUNITY): Payer: Self-pay | Admitting: Emergency Medicine

## 2023-08-07 DIAGNOSIS — S0990XA Unspecified injury of head, initial encounter: Secondary | ICD-10-CM | POA: Diagnosis present

## 2023-08-07 DIAGNOSIS — Y92410 Unspecified street and highway as the place of occurrence of the external cause: Secondary | ICD-10-CM | POA: Diagnosis not present

## 2023-08-07 DIAGNOSIS — S060X0A Concussion without loss of consciousness, initial encounter: Secondary | ICD-10-CM | POA: Insufficient documentation

## 2023-08-07 LAB — COMPREHENSIVE METABOLIC PANEL
ALT: 20 U/L (ref 0–44)
AST: 24 U/L (ref 15–41)
Albumin: 4.5 g/dL (ref 3.5–5.0)
Alkaline Phosphatase: 59 U/L (ref 38–126)
Anion gap: 11 (ref 5–15)
BUN: 12 mg/dL (ref 6–20)
CO2: 23 mmol/L (ref 22–32)
Calcium: 9.7 mg/dL (ref 8.9–10.3)
Chloride: 100 mmol/L (ref 98–111)
Creatinine, Ser: 1.18 mg/dL (ref 0.61–1.24)
GFR, Estimated: >60 mL/min (ref >60)
Glucose, Bld: 224 mg/dL — ABNORMAL HIGH (ref 70–99)
Potassium: 3.6 mmol/L (ref 3.5–5.1)
Sodium: 134 mmol/L — ABNORMAL LOW (ref 135–145)
Total Bilirubin: 0.8 mg/dL (ref <1.2)
Total Protein: 7.6 g/dL (ref 6.5–8.1)

## 2023-08-07 LAB — CBC WITH DIFFERENTIAL/PLATELET
Abs Immature Granulocytes: 0.02 10*3/uL (ref 0.00–0.07)
Basophils Absolute: 0 10*3/uL (ref 0.0–0.1)
Basophils Relative: 1 %
Eosinophils Absolute: 0.2 10*3/uL (ref 0.0–0.5)
Eosinophils Relative: 3 %
HCT: 42.2 % (ref 39.0–52.0)
Hemoglobin: 14.5 g/dL (ref 13.0–17.0)
Immature Granulocytes: 0 %
Lymphocytes Relative: 25 %
Lymphs Abs: 1.7 10*3/uL (ref 0.7–4.0)
MCH: 33.3 pg (ref 26.0–34.0)
MCHC: 34.4 g/dL (ref 30.0–36.0)
MCV: 97 fL (ref 80.0–100.0)
Monocytes Absolute: 0.3 10*3/uL (ref 0.1–1.0)
Monocytes Relative: 5 %
Neutro Abs: 4.3 10*3/uL (ref 1.7–7.7)
Neutrophils Relative %: 66 %
Platelets: 224 10*3/uL (ref 150–400)
RBC: 4.35 MIL/uL (ref 4.22–5.81)
RDW: 12.3 % (ref 11.5–15.5)
WBC: 6.6 10*3/uL (ref 4.0–10.5)
nRBC: 0 % (ref 0.0–0.2)

## 2023-08-07 LAB — MAGNESIUM: Magnesium: 2 mg/dL (ref 1.7–2.4)

## 2023-08-07 MED ORDER — METOCLOPRAMIDE HCL 5 MG/ML IJ SOLN
10.0000 mg | Freq: Once | INTRAMUSCULAR | Status: AC
  Administered 2023-08-07: 10 mg via INTRAVENOUS
  Filled 2023-08-07: qty 2

## 2023-08-07 MED ORDER — DIPHENHYDRAMINE HCL 50 MG/ML IJ SOLN
12.5000 mg | Freq: Once | INTRAMUSCULAR | Status: AC
  Administered 2023-08-07: 12.5 mg via INTRAVENOUS
  Filled 2023-08-07: qty 1

## 2023-08-07 MED ORDER — LACTATED RINGERS IV BOLUS: 1000.0000 mL | Freq: Once | INTRAVENOUS | Status: DC

## 2023-08-07 MED ORDER — SODIUM CHLORIDE 0.9 % IV BOLUS
1000.0000 mL | Freq: Once | INTRAVENOUS | Status: AC
  Administered 2023-08-07: 1000 mL via INTRAVENOUS

## 2023-08-07 MED ORDER — ONDANSETRON 4 MG PO TBDP: 4.0000 mg | ORAL_TABLET | Freq: Three times a day (TID) | ORAL | 0 refills | Status: AC | PRN

## 2023-08-07 MED ORDER — KETOROLAC TROMETHAMINE 15 MG/ML IJ SOLN
15.0000 mg | Freq: Once | INTRAMUSCULAR | Status: AC
  Administered 2023-08-07: 15 mg via INTRAVENOUS
  Filled 2023-08-07: qty 1
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
## 2023-08-07 NOTE — ED Notes (Signed)
Patient returned from CT
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
## 2024-02-12 ENCOUNTER — Encounter (HOSPITAL_COMMUNITY): Payer: Self-pay

## 2024-02-12 ENCOUNTER — Emergency Department (HOSPITAL_COMMUNITY)
Admission: EM | Admit: 2024-02-12 | Discharge: 2024-02-12 | Disposition: A | Discharge status: Home / Self Care | Payer: Self-pay | Attending: Emergency Medicine | Admitting: Emergency Medicine

## 2024-02-12 ENCOUNTER — Other Ambulatory Visit: Payer: Self-pay

## 2024-02-12 DIAGNOSIS — K047 Periapical abscess without sinus: Secondary | ICD-10-CM | POA: Insufficient documentation

## 2024-02-12 MED ORDER — CLINDAMYCIN HCL 300 MG PO CAPS: 300.0000 mg | ORAL_CAPSULE | Freq: Three times a day (TID) | ORAL | 0 refills | Status: AC

## 2024-02-12 MED ORDER — NAPROXEN 500 MG PO TABS: 500.0000 mg | ORAL_TABLET | Freq: Two times a day (BID) | ORAL | 2 refills | Status: AC
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
## 2024-02-12 NOTE — ED Triage Notes (Signed)
 Pt c/o right jaw pain and swelling that started yesterday. Pt has hx of dental abscess at same site.

## 2024-02-12 NOTE — Discharge Instructions (Addendum)
Schedule to see the dentist for evaluation °
